# Patient Record
Sex: Female | Born: 1975
Health system: Southern US, Community
[De-identification: ages and names within clinical notes are randomized; demographics above are authoritative.]

## PROBLEM LIST (undated history)

## (undated) DIAGNOSIS — N189 Chronic kidney disease, unspecified: Secondary | ICD-10-CM

## (undated) HISTORY — PX: BREAST ENHANCEMENT SURGERY: SHX7

## (undated) HISTORY — DX: Chronic kidney disease, unspecified: N18.9

---

## 2005-12-24 ENCOUNTER — Other Ambulatory Visit: Admission: RE | Admit: 2005-12-24 | Discharge: 2005-12-24 | Payer: Self-pay | Admitting: Obstetrics and Gynecology

## 2007-12-16 ENCOUNTER — Inpatient Hospital Stay (HOSPITAL_COMMUNITY): Admission: RE | Admit: 2007-12-16 | Discharge: 2007-12-19 | Payer: Self-pay | Admitting: Obstetrics and Gynecology

## 2008-10-08 ENCOUNTER — Ambulatory Visit: Payer: Self-pay | Admitting: Internal Medicine

## 2010-03-04 ENCOUNTER — Ambulatory Visit (HOSPITAL_COMMUNITY): Admission: RE | Admit: 2010-03-04 | Discharge: 2010-03-04 | Payer: Self-pay | Admitting: Orthopedic Surgery

## 2010-03-04 ENCOUNTER — Encounter: Admission: RE | Admit: 2010-03-04 | Discharge: 2010-03-04 | Payer: Self-pay | Admitting: Obstetrics and Gynecology

## 2011-04-14 NOTE — Op Note (Signed)
NAMEBERLIN, VIERECK               ACCOUNT NO.:  [id number]   MEDICAL RECORD NO.:  [id number]          PATIENT TYPE:  INP   LOCATION:  9199                          FACILITY:  [facility]   PHYSICIAN:  Huel Cote, M.D. DATE OF BIRTH:  1976-05-01   DATE OF PROCEDURE:  12/16/2007  DATE OF DISCHARGE:                               OPERATIVE REPORT   PREOPERATIVE DIAGNOSES:  1. Term pregnancy at 39 plus weeks.  2. Previous C-section, declines VBAC (vaginal birth after cesarean).  3. Nephropathy, stable with a normal creatinine.   POSTOPERATIVE DIAGNOSES:  1. Term pregnancy at 39 plus weeks.  2. Previous C-section, declines VBAC (vaginal birth after cesarean).  3. Nephropathy, stable with a normal creatinine.   PROCEDURE:  Repeat low transverse C-section.   SURGEON:  Huel Cote, M.D.   ASSISTANT:  Zenaida Niece, M.D.   ANESTHESIA:  Spinal.   FINDINGS:  A viable female infant in vertex presentation.  Apgars were 9  and 9.  Weight was 7 pounds 13 ounces.  There was a nuchal cord x2 which  was loose and reduced over the head.  Normal uterus, tubes and ovaries  were noted.  Specimen was the placenta which was sent to labor and  delivery after cord blood collection.   ESTIMATED BLOOD LOSS:  Seven hundred mL.   URINE OUTPUT:  Four hundred and 50 mL clear urine.   IV FLUIDS:  Three thousand mL LR.   PROCEDURE IN DETAIL:  The patient was taken to the operating room where  spinal anesthesia was obtained without difficulty.  She was then prepped  and draped in normal sterile fashion in the dorsal supine position with  a leftward tilt.  A Pfannenstiel's skin incision was then made through a  preexisting scar and carried through to underlying layer of fascia by  sharp dissection and Bovie cautery.  Fascia was then nicked in the  midline and the incision was extended laterally with Mayo scissors.  Inferior aspect was grasped with Kocher clamps, elevated and dissected  off the  underlying rectus muscles.  In a similar fashion the superior  aspect was dissected off the rectus muscles.  The rest of the rectus  muscles were separated in the midline and the peritoneal cavity entered  bluntly.  The peritoneal incision was then extended both superiorly and  inferiorly with careful attention to avoid both bowel and bladder.  The  Alexis self retaining wound retractor was then placed within the  incision and the lower uterine segment exposed nicely.  A transverse  incision was then made with a scalpel and the uterine cavity was entered  bluntly.  This was then extended bluntly.  The bag was ruptured with  Allis clamp and clear fluid noted.  The infant's head was then delivered  atraumatically.  The nose and mouth bulb suctioned.  The remainder of  the body were delivered after the nuchal cord reduced without  difficulty.  The cord was clamped and cut and the infant was handed to  waiting pediatricians.  The uterus was massaged and the placenta  expressed spontaneously.  It was then cleared of all clots and debris  with moist lap sponge.  The uterine incision was then closed in two  layers, the first a running locked layer of zero chromic, similarly the  second layer was also zero chromic and this was done in an imbricating  fashion.  At the conclusion of the uterine closure the incision appeared  completely hemostatic.  Tubes and ovaries were inspected and found to be  normal.  The gutters were cleared of any clots and debris and the pelvis  and the uterine incision once again inspected and found to be  hemostatic.   At this point the Jon Gills was removed from the incision.  The rectus  muscles were reapproximated and the subfascial planes inspected and  found to be hemostatic.  Any areas of oozing were controlled with Bovie  cautery and finally the fascia was closed with zero Vicryl in a running  fashion and the skin was closed with staples.  Sponge, lap and needle   counts were correct x2 and the patient was taken to the recovery room in  stable condition.      Huel Cote, M.D.  Electronically Signed     KR/MEDQ  D:  12/16/2007  T:  12/16/2007  Job:  [id number]

## 2011-04-14 NOTE — H&P (Signed)
NAMECARL, BLEECKER NO.:  [id number]   MEDICAL RECORD NO.:  [id number]          PATIENT TYPE:  INP   LOCATION:  NA                            [facility]:  [facility]   PHYSICIAN:  Huel Cote, M.D. DATE OF BIRTH:  1976-07-03   DATE OF ADMISSION:  DATE OF DISCHARGE:                              HISTORY & PHYSICAL   DATE OF SURGERY:  December 16, 2007 at 9:45 a.m.   HISTORY:  The patient is a 35 year old G4 P1 021 who is coming in at [redacted]  weeks gestation for a scheduled repeat cesarean section and declines a  trial of labor, given a previous cesarean section.  The patient's  prenatal care has been complicated only by a history of nephropathy in  the patient which has been stable.  She had no other significant issues  and has had no significant proteinuria the entire pregnancy except on  her last visit in the office the day before surgery was noted to have 1+  protein, however did have a creatinine performed on her preop labs which  was normal.  She is followed by nephrology and will see Dr. Eliott Nine  approximately 2 months postpartum for a followup of this kidney  function.   PRENATAL LABS:  B+, antibody negative, RPR was nonreactive.  Rubella  immune, hepatitis B surface antigen negative, HIV negative, GC negative,  chlamydia negative, group B strep negative.  First trimester screen also  within normal limits.  Fetal anatomy scan performed at 18 weeks was  normal except for some isolated chloropexia cysts.  Past.   OBSTETRICAL HISTORY:  Significant in 2005 for a 8 pound 1 ounce infant  at 40 weeks delivered by cesarean section due to fetal heart rate  decelerations.  She then had 2 spontaneous miscarriages in 2007.   PAST MEDICAL HISTORY:  MPGN nephropathy which has been stable the entire  pregnancy, except for some isolated increased proteinuria the day before  her surgery.   PAST GYN HISTORY:  No significant abnormal Pap smears recently.   PAST SURGICAL  HISTORY:  In 2005 she had the cesarean section and some  kidney biopsies as a child.   ALLERGIES:  She is sensitive to LATEX and avoids nonsteroidals secondary  to her kidney disease.   MEDICATIONS:  None.   On admission for her cesarean section, she is afebrile, stable vital  signs.  Fetal heart rate was normal.  Her weight is 186 pounds, blood  pressure 128/78.  CARDIAC EXAM:  Regular rate and rhythm.  LUNGS:  Clear.  ABDOMEN:  Soft, gravid and nontender.  She had a cervical exam which  demonstrated a cervix which was 50 and 1+ dilated, vertex presentation.   The risks and benefits of surgery were discussed with the patient in  detail, including bleeding, infection, and possibly damage to bowel and  bladder.  The patient understands these risks and desires to proceed  with the surgery as stated.  We will undergo repeat low transverse  cesarean section and follow her proteinuria and creatinine  postoperatively.  These have been of no significance thus  far and her  blood pressures have been normal, with no preeclamptic symptoms.  The  patient again understands all risks and benefits and desires to proceed  with the surgery as stated.      Huel Cote, M.D.  Electronically Signed     KR/MEDQ  D:  12/15/2007  T:  12/15/2007  Job:  [id number]

## 2011-04-17 NOTE — Discharge Summary (Signed)
NAMEHARLIE, Vickie Washington               ACCOUNT NO.:  [id number]   MEDICAL RECORD NO.:  [id number]          PATIENT TYPE:  INP   LOCATION:  9109                          [facility]   PHYSICIAN:  Huel Cote, M.D. DATE OF BIRTH:  [birth year]   DATE OF ADMISSION:  12/16/2007  DATE OF DISCHARGE:  12/19/2007                               DISCHARGE SUMMARY   DISCHARGE DIAGNOSES:  1. Term pregnancy at 39 weeks, delivered.  2. Previous cesarean section.  Declined trial of labor.  3. Nephropathy which was stable.  4. Status post repeat low transverse C-section.   DISCHARGE MEDICATIONS:  1. Percocet 1-2 tablets p.o. every 4-6 hours p.r.n.  2. Prenatal vitamins.   DISCHARGE FOLLOWUP:  The patient is to follow up in my office in 2 weeks  for an incision check.   HOSPITAL COURSE:  The patient is a 35 year old, G4, P0-2-1 who came in  for a scheduled repeat C-section given a previous C-section and decline  of trial of labor.  For her complete history and physical, please see  the dictated history and physical done prior.  The patient's prenatal  care was essentially uncomplicated except for no nephropathy which was  followed and stable throughout the pregnancy with only a small amount of  proteinuria noted the day prior to delivery and a stable creatinine.  She underwent a repeat low transverse C-section with delivery of a  viable female infant, Apgar's were 9 and 9, weight 7 pounds 13 ounces.  There was a nuchal cord x2.  Normal uterus, tubes and ovaries were  noted.  She had an estimated blood loss of 700 mL and was admitted for  routine postoperative care.  On postop day #1, creatinine was stable at  0.6.  Hemoglobin was 11 down from 11.9.  She was doing well, tolerating  regular diet.  By postop day #3, she was ambulating well.  Pain was well-  controlled and incision was clear.  Staples were removed and Steri-  Strips placed.  She was felt stable for discharge home.      Huel Cote, M.D.  Electronically Signed     KR/MEDQ  D:  01/11/2008  T:  01/12/2008  Job:  [id number]

## 2011-08-20 LAB — BASIC METABOLIC PANEL
Calcium: 8.7
Chloride: 104
Creatinine, Ser: 0.6
GFR calc Af Amer: >60
Potassium: 3.9

## 2011-08-20 LAB — CBC
HCT: 34.2 — ABNORMAL LOW
Hemoglobin: 11.9 — ABNORMAL LOW
MCHC: 34.7
Platelets: 210
RBC: 3.23 — ABNORMAL LOW
RBC: 3.58 — ABNORMAL LOW
RDW: 14.8

## 2011-08-20 LAB — COMPREHENSIVE METABOLIC PANEL
AST: 25
Chloride: 104
Creatinine, Ser: 0.55
Sodium: 134 — ABNORMAL LOW

## 2011-08-20 LAB — CCBB MATERNAL DONOR DRAW

## 2016-09-07 ENCOUNTER — Ambulatory Visit (INDEPENDENT_AMBULATORY_CARE_PROVIDER_SITE_OTHER): Payer: 59 | Admitting: Orthopaedic Surgery

## 2016-09-07 DIAGNOSIS — S76012A Strain of muscle, fascia and tendon of left hip, initial encounter: Secondary | ICD-10-CM | POA: Diagnosis not present

## 2016-09-28 ENCOUNTER — Ambulatory Visit (INDEPENDENT_AMBULATORY_CARE_PROVIDER_SITE_OTHER): Payer: 59 | Admitting: Physician Assistant

## 2016-09-28 ENCOUNTER — Encounter (INDEPENDENT_AMBULATORY_CARE_PROVIDER_SITE_OTHER): Payer: Self-pay | Admitting: Physician Assistant

## 2016-09-28 VITALS — Ht 65.0 in | Wt 128.0 lb

## 2016-09-28 DIAGNOSIS — M25552 Pain in left hip: Secondary | ICD-10-CM

## 2016-09-28 NOTE — Addendum Note (Signed)
Addended by: Cleda Clarks on: 09/28/2016 09:16 AM   Modules accepted: Orders

## 2016-09-28 NOTE — Progress Notes (Signed)
   Office Visit Note   Patient: Vickie Washington           Date of Birth: [birth year]           MRN: [id number] Visit Date: 09/28/2016              Requested by: No referring provider defined for this encounter. PCP: Pcp Not In System   Assessment & Plan: Visit Diagnoses: No diagnosis found.  Plan: MRI Left hip to rule out any cartilage involvement   Follow-Up Instructions: No Follow-up on file.   Orders:  No orders of the defined types were placed in this encounter.  No orders of the defined types were placed in this encounter.     Procedures: No procedures performed   Clinical Data: No additional findings.   Subjective: No chief complaint on file.   Patient returns for follow up on left hip. States pain is some better. Doing Pt, Dry needling. States feels pulling sensation when carrying something. Still unable to run. Also states steroid pack was little to no help. The patient reports that her hip pain remains 7 out of 10 at worst. She denies any radicular symptoms down the left hip. Continues to have pain in the groin area of her left hip no longer having lateral hip pain. She also reports popping sensation in the hip with abduction but this is not painful.    Review of Systems See HPI   Objective: Vital Signs: There were no vitals taken for this visit.  Physical Exam  Left Hip Exam  Left hip exam is normal.  Comments:  She has pain with flexion and external rotation of the left hip.      Specialty Comments:  No specialty comments available.  Imaging: No results found.   PMFS History: There are no active problems to display for this patient.  No past medical history on file.  No family history on file.  No past surgical history on file. Social History   Occupational History  . Not on file.   Social History Main Topics  . Smoking status: Not on file  . Smokeless tobacco: Not on file  . Alcohol use Not on file  . Drug use: Unknown  .  Sexual activity: Not on file

## 2016-10-03 ENCOUNTER — Ambulatory Visit
Admission: RE | Admit: 2016-10-03 | Discharge: 2016-10-03 | Disposition: A | Discharge status: Home / Self Care | Payer: 59 | Source: Ambulatory Visit | Attending: Physician Assistant | Admitting: Physician Assistant

## 2016-10-03 DIAGNOSIS — M25552 Pain in left hip: Secondary | ICD-10-CM

## 2016-10-12 ENCOUNTER — Ambulatory Visit (INDEPENDENT_AMBULATORY_CARE_PROVIDER_SITE_OTHER): Payer: 59 | Admitting: Physician Assistant

## 2016-10-19 ENCOUNTER — Ambulatory Visit (INDEPENDENT_AMBULATORY_CARE_PROVIDER_SITE_OTHER): Payer: 59 | Admitting: Physician Assistant

## 2016-10-19 ENCOUNTER — Encounter (INDEPENDENT_AMBULATORY_CARE_PROVIDER_SITE_OTHER): Payer: Self-pay | Admitting: Physician Assistant

## 2016-10-19 DIAGNOSIS — M25552 Pain in left hip: Secondary | ICD-10-CM

## 2016-10-19 NOTE — Progress Notes (Signed)
   Office Visit Note   Patient: Vickie Washington           Date of Birth: 07/29/1976           MRN: [id number] Visit Date: 10/19/2016              Requested by: No referring provider defined for this encounter. PCP: Pcp Not In System   Assessment & Plan: Visit Diagnoses: No diagnosis found.  Plan: Continue protected weightbearing for the next 1-2 weeks. Keep her work for the next 2 weeks Recommend MRI. Exam 3 months from the last  Follow-Up Instructions: Return in about 4 weeks (around 11/16/2016).   Orders:  No orders of the defined types were placed in this encounter.  No orders of the defined types were placed in this encounter.     Procedures: No procedures performed   Clinical Data: No additional findings.   Subjective: Chief Complaint  Patient presents with  . Left Hip - Follow-up    MRI review    [name] is here to review MRI of Left hip.  She states that she has been using one crutch. Overall doing some better, that at the end of day its very uncomfortable and is a sore ache, but not so bad she has to take medication for it.MRI showed a stress fracture along the medial aspect of the lower femoral neckand intertrochanteric region near the lesser trochanter.She has been protective weight bearing with a crutch for at least 5 days. She's cut out all lower body exercises. Still has soreness muscle the aching sensation in the left hip and anterior thigh    Review of Systems   Objective: Vital Signs: LMP 09/28/2016   Physical Exam  Ortho Exam   Ambulates without any assistive device and a nonantalgic gait   Specialty Comments:  No specialty comments available.  Imaging: No results found.   PMFS History: There are no active problems to display for this patient.  No past medical history on file.  No family history on file.  No past surgical history on file. Social History   Occupational History  . Not on file.   Social History Main Topics  .  Smoking status: Never Smoker  . Smokeless tobacco: Never Used  . Alcohol use Not on file  . Drug use: Unknown  . Sexual activity: Not on file

## 2016-11-16 ENCOUNTER — Ambulatory Visit (INDEPENDENT_AMBULATORY_CARE_PROVIDER_SITE_OTHER): Payer: 59 | Admitting: Orthopaedic Surgery

## 2016-11-16 ENCOUNTER — Telehealth (INDEPENDENT_AMBULATORY_CARE_PROVIDER_SITE_OTHER): Payer: Self-pay | Admitting: *Deleted

## 2016-11-16 NOTE — Telephone Encounter (Signed)
Pt called stating she didn't need to come back until after MRI sometime in feb. But pt wasn't sure if MRI has been ordered yet. CB: [phone]

## 2016-11-16 NOTE — Telephone Encounter (Signed)
Pt with pt and per ov note pt is to have repeat MRI L hip in 3 months from last MRI which would fall on Feb 4, pt will call me back to remind of MRI.

## 2016-11-16 NOTE — Telephone Encounter (Signed)
Please advise 

## 2017-01-06 ENCOUNTER — Other Ambulatory Visit (INDEPENDENT_AMBULATORY_CARE_PROVIDER_SITE_OTHER): Payer: Self-pay

## 2017-01-06 DIAGNOSIS — M25552 Pain in left hip: Secondary | ICD-10-CM

## 2017-01-13 ENCOUNTER — Ambulatory Visit
Admission: RE | Admit: 2017-01-13 | Discharge: 2017-01-13 | Disposition: A | Discharge status: Home / Self Care | Payer: 59 | Source: Ambulatory Visit | Attending: Orthopaedic Surgery | Admitting: Orthopaedic Surgery

## 2017-01-13 DIAGNOSIS — M25552 Pain in left hip: Secondary | ICD-10-CM

## 2018-07-01 ENCOUNTER — Other Ambulatory Visit: Payer: Self-pay | Admitting: Obstetrics and Gynecology

## 2018-07-01 DIAGNOSIS — R928 Other abnormal and inconclusive findings on diagnostic imaging of breast: Secondary | ICD-10-CM

## 2018-07-07 ENCOUNTER — Other Ambulatory Visit: Payer: Self-pay | Admitting: Obstetrics and Gynecology

## 2018-07-07 ENCOUNTER — Ambulatory Visit
Admission: RE | Admit: 2018-07-07 | Discharge: 2018-07-07 | Disposition: A | Discharge status: Home / Self Care | Payer: 59 | Source: Ambulatory Visit | Attending: Obstetrics and Gynecology | Admitting: Obstetrics and Gynecology

## 2018-07-07 DIAGNOSIS — R928 Other abnormal and inconclusive findings on diagnostic imaging of breast: Secondary | ICD-10-CM

## 2018-07-07 DIAGNOSIS — N63 Unspecified lump in unspecified breast: Secondary | ICD-10-CM

## 2018-08-10 ENCOUNTER — Encounter (INDEPENDENT_AMBULATORY_CARE_PROVIDER_SITE_OTHER): Payer: Self-pay | Admitting: Orthopaedic Surgery

## 2018-08-10 ENCOUNTER — Ambulatory Visit (INDEPENDENT_AMBULATORY_CARE_PROVIDER_SITE_OTHER): Payer: 59 | Admitting: Orthopaedic Surgery

## 2018-08-10 ENCOUNTER — Ambulatory Visit (INDEPENDENT_AMBULATORY_CARE_PROVIDER_SITE_OTHER): Payer: Self-pay

## 2018-08-10 DIAGNOSIS — M79671 Pain in right foot: Secondary | ICD-10-CM

## 2018-08-10 MED ORDER — DICLOFENAC SODIUM 1 % TD GEL: 2.0000 g | Freq: Four times a day (QID) | TRANSDERMAL | 3 refills | Status: AC

## 2018-08-10 NOTE — Progress Notes (Signed)
Office Visit Note   Patient: Vickie Washington           Date of Birth: [birth year]           MRN: [id number] Visit Date: 08/10/2018              Requested by: No referring provider defined for this encounter. PCP: System, Pcp Not In   Assessment & Plan: Visit Diagnoses:  1. Pain of right heel     Plan: She certainly understands that this is definitely an acute injury to her ankle.  We will put her in a short cam walking boot and shut her down completely for the next 2 weeks before allowing her gradual return to activities.  We will try some Voltaren gel as well this area.  She will let me know if things are worsening in any way.  If they do not get better my next step would be an MRI. Follow-Up Instructions: Return if symptoms worsen or fail to improve.   Orders:  Orders Placed This Encounter  Procedures  . XR Os Calcis Right   Meds ordered this encounter  Medications  . diclofenac sodium (VOLTAREN) 1 % GEL    Sig: Apply 2 g topically 4 (four) times daily.    Dispense:  100 g    Refill:  3      Procedures: No procedures performed   Clinical Data: No additional findings.   Subjective: Chief Complaint  Patient presents with  . Right Ankle - Pain, Injury  Vickie Washington comes in today for evaluation treatment of an acute injury to her right ankle and heel.  She points to the heel medially and the course the posterior tibial tendon is source of her pain.  This happened after an acute injury last week coming down some steps.  She is is walking barefoot is significantly painful to her but wearing shoes that are well fitting she is pain-free.  She is also been training for a race for the first week in November which is a marathon.  She has not injured this area before.  She has had ankle twisting injury before but not to this area.  She is someone that has had a hip stress fracture in the past.  Her bone density ended up being normal and she healed that fracture.  HPI  Review of  Systems She currently denies any headache, chest pain, short of breath, fever, chills, nausea, vomiting.  Objective: Vital Signs: There were no vitals taken for this visit.  Physical Exam She is alert and oriented x3 and in no acute distress Ortho Exam Examination of her right foot and ankle shows that she can perform a straight toe raise easily.  There is tenderness of the calcaneus and tenderness along the course of the posterior tibial tendon.  The remainder the foot and ankle exam is normal.  The foot contours appear normal. Specialty Comments:  No specialty comments available.  Imaging: Xr Os Calcis Right  Result Date: 08/10/2018 2 views of the calcaneus show no obvious acute bony injury.    PMFS History: There are no active problems to display for this patient.  History reviewed. No pertinent past medical history.  History reviewed. No pertinent family history.  History reviewed. No pertinent surgical history. Social History   Occupational History  . Not on file  Tobacco Use  . Smoking status: Never Smoker  . Smokeless tobacco: Never Used  Substance and Sexual Activity  . Alcohol use: Not on  file  . Drug use: Not on file  . Sexual activity: Not on file

## 2019-01-10 ENCOUNTER — Other Ambulatory Visit: Payer: 59

## 2019-01-11 ENCOUNTER — Other Ambulatory Visit: Payer: Self-pay | Admitting: Obstetrics and Gynecology

## 2019-01-11 ENCOUNTER — Ambulatory Visit
Admission: RE | Admit: 2019-01-11 | Discharge: 2019-01-11 | Disposition: A | Discharge status: Home / Self Care | Payer: BLUE CROSS/BLUE SHIELD | Source: Ambulatory Visit | Attending: Obstetrics and Gynecology | Admitting: Obstetrics and Gynecology

## 2019-01-11 DIAGNOSIS — N6323 Unspecified lump in the left breast, lower outer quadrant: Secondary | ICD-10-CM | POA: Diagnosis not present

## 2019-01-11 DIAGNOSIS — N63 Unspecified lump in unspecified breast: Secondary | ICD-10-CM

## 2019-07-13 ENCOUNTER — Ambulatory Visit
Admission: RE | Admit: 2019-07-13 | Discharge: 2019-07-13 | Disposition: A | Discharge status: Home / Self Care | Payer: BLUE CROSS/BLUE SHIELD | Source: Ambulatory Visit | Attending: Obstetrics and Gynecology | Admitting: Obstetrics and Gynecology

## 2019-07-13 ENCOUNTER — Other Ambulatory Visit: Payer: Self-pay

## 2019-07-13 DIAGNOSIS — N6323 Unspecified lump in the left breast, lower outer quadrant: Secondary | ICD-10-CM | POA: Diagnosis not present

## 2019-07-13 DIAGNOSIS — N6321 Unspecified lump in the left breast, upper outer quadrant: Secondary | ICD-10-CM | POA: Diagnosis not present

## 2019-07-13 DIAGNOSIS — R928 Other abnormal and inconclusive findings on diagnostic imaging of breast: Secondary | ICD-10-CM | POA: Diagnosis not present

## 2019-07-13 DIAGNOSIS — N63 Unspecified lump in unspecified breast: Secondary | ICD-10-CM

## 2019-11-13 DIAGNOSIS — Z01419 Encounter for gynecological examination (general) (routine) without abnormal findings: Secondary | ICD-10-CM | POA: Diagnosis not present

## 2019-11-13 DIAGNOSIS — Z1389 Encounter for screening for other disorder: Secondary | ICD-10-CM | POA: Diagnosis not present

## 2019-11-13 DIAGNOSIS — Z6825 Body mass index (BMI) 25.0-25.9, adult: Secondary | ICD-10-CM | POA: Diagnosis not present

## 2019-11-13 DIAGNOSIS — Z130 Encounter for screening for diseases of the blood and blood-forming organs and certain disorders involving the immune mechanism: Secondary | ICD-10-CM | POA: Diagnosis not present

## 2020-04-13 ENCOUNTER — Ambulatory Visit: Payer: BC Managed Care – PPO

## 2020-06-06 ENCOUNTER — Other Ambulatory Visit: Payer: Self-pay | Admitting: Obstetrics and Gynecology

## 2020-06-06 DIAGNOSIS — N63 Unspecified lump in unspecified breast: Secondary | ICD-10-CM

## 2020-06-10 DIAGNOSIS — Z0000 Encounter for general adult medical examination without abnormal findings: Secondary | ICD-10-CM | POA: Diagnosis not present

## 2020-06-10 DIAGNOSIS — M859 Disorder of bone density and structure, unspecified: Secondary | ICD-10-CM | POA: Diagnosis not present

## 2020-06-17 DIAGNOSIS — Z1212 Encounter for screening for malignant neoplasm of rectum: Secondary | ICD-10-CM | POA: Diagnosis not present

## 2020-06-17 DIAGNOSIS — Z1331 Encounter for screening for depression: Secondary | ICD-10-CM | POA: Diagnosis not present

## 2020-06-17 DIAGNOSIS — Z87312 Personal history of (healed) stress fracture: Secondary | ICD-10-CM | POA: Diagnosis not present

## 2020-06-17 DIAGNOSIS — G43909 Migraine, unspecified, not intractable, without status migrainosus: Secondary | ICD-10-CM | POA: Diagnosis not present

## 2020-06-17 DIAGNOSIS — N052 Unspecified nephritic syndrome with diffuse membranous glomerulonephritis: Secondary | ICD-10-CM | POA: Diagnosis not present

## 2020-06-17 DIAGNOSIS — Z0000 Encounter for general adult medical examination without abnormal findings: Secondary | ICD-10-CM | POA: Diagnosis not present

## 2020-06-17 DIAGNOSIS — M8580 Other specified disorders of bone density and structure, unspecified site: Secondary | ICD-10-CM | POA: Diagnosis not present

## 2020-06-17 DIAGNOSIS — R82998 Other abnormal findings in urine: Secondary | ICD-10-CM | POA: Diagnosis not present

## 2020-07-16 ENCOUNTER — Ambulatory Visit
Admission: RE | Admit: 2020-07-16 | Discharge: 2020-07-16 | Disposition: A | Discharge status: Home / Self Care | Payer: BC Managed Care – PPO | Source: Ambulatory Visit | Attending: Obstetrics and Gynecology | Admitting: Obstetrics and Gynecology

## 2020-07-16 ENCOUNTER — Other Ambulatory Visit: Payer: Self-pay

## 2020-07-16 DIAGNOSIS — R922 Inconclusive mammogram: Secondary | ICD-10-CM | POA: Diagnosis not present

## 2020-07-16 DIAGNOSIS — N63 Unspecified lump in unspecified breast: Secondary | ICD-10-CM

## 2020-07-16 DIAGNOSIS — N6002 Solitary cyst of left breast: Secondary | ICD-10-CM | POA: Diagnosis not present

## 2020-07-16 DIAGNOSIS — R928 Other abnormal and inconclusive findings on diagnostic imaging of breast: Secondary | ICD-10-CM | POA: Diagnosis not present

## 2020-09-17 DIAGNOSIS — Z20822 Contact with and (suspected) exposure to covid-19: Secondary | ICD-10-CM | POA: Diagnosis not present

## 2021-02-04 ENCOUNTER — Telehealth: Payer: Self-pay

## 2021-02-04 NOTE — Telephone Encounter (Signed)
Left message on machine to call back  

## 2021-02-04 NOTE — Telephone Encounter (Signed)
Vickie Banister, MD  Erroll Luna, MD; Timothy Lasso, RN Got it, thanks   She should expect a call at that number sometime today or tomorrow.      Lucile Didonato,  See below. Thanks!   DJ        Previous Messages   ----- Message -----  From: Erroll Luna, MD  Sent: 02/04/2021  7:20 AM EST  To: Vickie Banister, MD  Subject: [name]  DOB 05-30-1976   [id number]   44 yo otherwise healthy   [address] [place] [address]   Doug Shaw PCP   BC/BS insurance    Thx Again Dorian Heckle   ----- Message -----  From: Vickie Banister, MD  Sent: 02/03/2021  1:46 PM EST  To: Erroll Luna, MD, Timothy Lasso, RN  Subject: RE: question                   Tom,  Happy to help. Can you send some demographics and a good contact number to reach her and I will have [name] reach out to coordinate. Thanks.    Woodroe Chen    ----- Message -----  From: Erroll Luna, MD  Sent: 02/03/2021  1:44 PM EST  To: Vickie Banister, MD  Subject: question                     [name]  My wife is 27 soon to be 36 and wanted to get a screening colonoscopy   I recommended you for that    Can be done any time this year    Had a grandfather with colon cancer in his 57"s who died from it    Any help appreciated   Tom

## 2021-02-05 NOTE — Telephone Encounter (Signed)
The pt has been scheduled for pre visit and colon.  She will call with any questions or concerns

## 2021-02-27 DIAGNOSIS — Z1151 Encounter for screening for human papillomavirus (HPV): Secondary | ICD-10-CM | POA: Diagnosis not present

## 2021-02-27 DIAGNOSIS — Z130 Encounter for screening for diseases of the blood and blood-forming organs and certain disorders involving the immune mechanism: Secondary | ICD-10-CM | POA: Diagnosis not present

## 2021-02-27 DIAGNOSIS — Z6825 Body mass index (BMI) 25.0-25.9, adult: Secondary | ICD-10-CM | POA: Diagnosis not present

## 2021-02-27 DIAGNOSIS — Z124 Encounter for screening for malignant neoplasm of cervix: Secondary | ICD-10-CM | POA: Diagnosis not present

## 2021-02-27 DIAGNOSIS — Z01419 Encounter for gynecological examination (general) (routine) without abnormal findings: Secondary | ICD-10-CM | POA: Diagnosis not present

## 2021-03-10 ENCOUNTER — Other Ambulatory Visit: Payer: Self-pay

## 2021-03-10 ENCOUNTER — Ambulatory Visit (AMBULATORY_SURGERY_CENTER): Payer: Self-pay | Admitting: *Deleted

## 2021-03-10 VITALS — Ht 66.0 in | Wt 152.0 lb

## 2021-03-10 DIAGNOSIS — Z1211 Encounter for screening for malignant neoplasm of colon: Secondary | ICD-10-CM

## 2021-03-10 NOTE — Progress Notes (Signed)
Patient is here in-person for PV. Patient denies any allergies to eggs or soy. Patient denies any problems with anesthesia/sedation. Patient denies any oxygen use at home. Patient denies taking any diet/weight loss medications or blood thinners. Patient is not being treated for MRSA or C-diff. Patient is aware of our care-partner policy and [id number] safety protocol. EMMI education assigned to the patient for the procedure, sent to [facility].

## 2021-03-11 ENCOUNTER — Encounter: Payer: Self-pay | Admitting: Gastroenterology

## 2021-03-17 ENCOUNTER — Encounter: Payer: BC Managed Care – PPO | Admitting: Gastroenterology

## 2021-03-17 ENCOUNTER — Other Ambulatory Visit: Payer: Self-pay

## 2021-03-17 ENCOUNTER — Ambulatory Visit (AMBULATORY_SURGERY_CENTER): Payer: BC Managed Care – PPO | Admitting: Gastroenterology

## 2021-03-17 ENCOUNTER — Encounter: Payer: Self-pay | Admitting: Gastroenterology

## 2021-03-17 VITALS — BP 97/61 | HR 58 | Temp 98.2°F | Resp 11 | Ht 66.0 in | Wt 152.0 lb

## 2021-03-17 DIAGNOSIS — D124 Benign neoplasm of descending colon: Secondary | ICD-10-CM

## 2021-03-17 DIAGNOSIS — Z1211 Encounter for screening for malignant neoplasm of colon: Secondary | ICD-10-CM

## 2021-03-17 MED ORDER — SODIUM CHLORIDE 0.9 % IV SOLN: 500.0000 mL | Freq: Once | INTRAVENOUS | Status: DC

## 2021-03-17 NOTE — Progress Notes (Signed)
pt tolerated well. VSS. awake and to recovery. Report given to RN.  

## 2021-03-17 NOTE — Progress Notes (Signed)
Medical history reviewed with no changes since PV. VS assessed by C.W 

## 2021-03-17 NOTE — Progress Notes (Signed)
Called to room to assist during endoscopic procedure.  Patient ID and intended procedure confirmed with present staff. Received instructions for my participation in the procedure from the performing physician.  

## 2021-03-17 NOTE — Patient Instructions (Addendum)
Handout on polyps, diverticulosis  given Resume previous diet Continue current medications Await pathology results YOU HAD AN ENDOSCOPIC PROCEDURE TODAY AT [facility]:   Refer to the procedure report that was given to you for any specific questions about what was found during the examination.  If the procedure report does not answer your questions, please call your gastroenterologist to clarify.  If you requested that your care partner not be given the details of your procedure findings, then the procedure report has been included in a sealed envelope for you to review at your convenience later.  YOU SHOULD EXPECT: Some feelings of bloating in the abdomen. Passage of more gas than usual.  Walking can help get rid of the air that was put into your GI tract during the procedure and reduce the bloating. If you had a lower endoscopy (such as a colonoscopy or flexible sigmoidoscopy) you may notice spotting of blood in your stool or on the toilet paper. If you underwent a bowel prep for your procedure, you may not have a normal bowel movement for a few days.  Please Note:  You might notice some irritation and congestion in your nose or some drainage.  This is from the oxygen used during your procedure.  There is no need for concern and it should clear up in a day or so.  SYMPTOMS TO REPORT IMMEDIATELY:   Following lower endoscopy (colonoscopy or flexible sigmoidoscopy):  Excessive amounts of blood in the stool  Significant tenderness or worsening of abdominal pains  Swelling of the abdomen that is new, acute  Fever of 100F or higher  For urgent or emergent issues, a gastroenterologist can be reached at any hour by calling [phone]. Do not use MyChart messaging for urgent concerns.   DIET:  We do recommend a small meal at first, but then you may proceed to your regular diet.  Drink plenty of fluids but you should avoid alcoholic beverages for 24 hours.  ACTIVITY:  You should  plan to take it easy for the rest of today and you should NOT DRIVE or use heavy machinery until tomorrow (because of the sedation medicines used during the test).    FOLLOW UP: Our staff will call the number listed on your records 48-72 hours following your procedure to check on you and address any questions or concerns that you may have regarding the information given to you following your procedure. If we do not reach you, we will leave a message.  We will attempt to reach you two times.  During this call, we will ask if you have developed any symptoms of COVID 19. If you develop any symptoms (ie: fever, flu-like symptoms, shortness of breath, cough etc.) before then, please call [phone].  If you test positive for Covid 19 in the 2 weeks post procedure, please call and report this information to [place].    If any biopsies were taken you will be contacted by phone or by letter within the next 1-3 weeks.  Please call us at [phone] if you have not heard about the biopsies in 3 weeks.   SIGNATURES/CONFIDENTIALITY: You and/or your care partner have signed paperwork which will be entered into your electronic medical record.  These signatures attest to the fact that that the information above on your After Visit Summary has been reviewed and is understood.  Full responsibility of the confidentiality of this discharge information lies with you and/or your care-partner.

## 2021-03-17 NOTE — Op Note (Signed)
[facility] Patient Name: Vickie Washington Procedure Date: 03/17/2021 9:32 AM MRN: [id number] Endoscopist: Milus Banister , MD Age: 45 Referring MD:  Date of Birth: Dec 27, 1975 Gender: Female Account #: [id number] Procedure:                Colonoscopy Indications:              Screening for colorectal malignant neoplasm Medicines:                Monitored Anesthesia Care Procedure:                Pre-Anesthesia Assessment:                           - Prior to the procedure, a History and Physical                            was performed, and patient medications and                            allergies were reviewed. The patient's tolerance of                            previous anesthesia was also reviewed. The risks                            and benefits of the procedure and the sedation                            options and risks were discussed with the patient.                            All questions were answered, and informed consent                            was obtained. Prior Anticoagulants: The patient has                            taken no previous anticoagulant or antiplatelet                            agents. ASA Grade Assessment: I - A normal, healthy                            patient. After reviewing the risks and benefits,                            the patient was deemed in satisfactory condition to                            undergo the procedure.                           After obtaining informed consent, the colonoscope  was passed under direct vision. Throughout the                            procedure, the patient's blood pressure, pulse, and                            oxygen saturations were monitored continuously. The                            Colonoscope was introduced through the anus and                            advanced to the the cecum, identified by                            appendiceal orifice and ileocecal  valve. The                            colonoscopy was performed without difficulty. The                            patient tolerated the procedure well. The quality                            of the bowel preparation was good. The ileocecal                            valve, appendiceal orifice, and rectum were                            photographed. Scope In: 9:39:09 AM Scope Out: 9:53:10 AM Scope Withdrawal Time: 0 hours 8 minutes 6 seconds  Total Procedure Duration: 0 hours 14 minutes 1 second  Findings:                 A 3 mm polyp was found in the descending colon. The                            polyp was sessile. The polyp was removed with a                            cold snare. Resection and retrieval were complete.                           A few small-mouthed diverticula were found in the                            left colon.                           The exam was otherwise without abnormality on                            direct and retroflexion views. Complications:  No immediate complications. Estimated blood loss:                            None. Estimated Blood Loss:     Estimated blood loss: none. Impression:               - One 3 mm polyp in the descending colon, removed                            with a cold snare. Resected and retrieved.                           - Diverticulosis in the left colon.                           - The examination was otherwise normal on direct                            and retroflexion views. Recommendation:           - Patient has a contact number available for                            emergencies. The signs and symptoms of potential                            delayed complications were discussed with the                            patient. Return to normal activities tomorrow.                            Written discharge instructions were provided to the                            patient.                           -  Resume previous diet.                           - Continue present medications.                           - Await pathology results. Milus Banister, MD 03/17/2021 9:56:48 AM This report has been signed electronically.

## 2021-03-19 ENCOUNTER — Telehealth: Payer: Self-pay | Admitting: *Deleted

## 2021-03-19 ENCOUNTER — Telehealth: Payer: Self-pay

## 2021-03-19 NOTE — Telephone Encounter (Signed)
NO ANSWER, MESSAGE LEFT FOR PATIENT. 

## 2021-03-19 NOTE — Telephone Encounter (Signed)
  Follow up Call-  Call back number 03/17/2021  Post procedure Call Back phone  # [phone]  Permission to leave phone message Yes  Some recent data might be hidden     Patient questions:  Do you have a fever, pain , or abdominal swelling? No. Pain Score  0 *  Have you tolerated food without any problems? Yes.    Have you been able to return to your normal activities? Yes.    Do you have any questions about your discharge instructions: Diet   No. Medications  No. Follow up visit  No.  Do you have questions or concerns about your Care? No.  Actions: * If pain score is 4 or above: No action needed, pain <4.  1. Have you developed a fever since your procedure? no  2.   Have you had an respiratory symptoms (SOB or cough) since your procedure? no  3.   Have you tested positive for COVID 19 since your procedure no  4.   Have you had any family members/close contacts diagnosed with the COVID 19 since your procedure?  no   If yes to any of these questions please route to Joylene John, RN and Joella Prince, RN

## 2021-07-03 DIAGNOSIS — Z0000 Encounter for general adult medical examination without abnormal findings: Secondary | ICD-10-CM | POA: Diagnosis not present

## 2021-07-03 DIAGNOSIS — M8589 Other specified disorders of bone density and structure, multiple sites: Secondary | ICD-10-CM | POA: Diagnosis not present

## 2021-07-03 DIAGNOSIS — M8580 Other specified disorders of bone density and structure, unspecified site: Secondary | ICD-10-CM | POA: Diagnosis not present

## 2021-08-25 DIAGNOSIS — H5213 Myopia, bilateral: Secondary | ICD-10-CM | POA: Diagnosis not present

## 2021-10-15 ENCOUNTER — Other Ambulatory Visit: Payer: Self-pay | Admitting: Obstetrics and Gynecology

## 2021-10-15 DIAGNOSIS — Z1231 Encounter for screening mammogram for malignant neoplasm of breast: Secondary | ICD-10-CM

## 2021-11-19 ENCOUNTER — Other Ambulatory Visit: Payer: Self-pay | Admitting: Obstetrics and Gynecology

## 2021-11-19 ENCOUNTER — Ambulatory Visit
Admission: RE | Admit: 2021-11-19 | Discharge: 2021-11-19 | Disposition: A | Discharge status: Home / Self Care | Payer: 59 | Source: Ambulatory Visit | Attending: Obstetrics and Gynecology | Admitting: Obstetrics and Gynecology

## 2021-11-19 DIAGNOSIS — Z1231 Encounter for screening mammogram for malignant neoplasm of breast: Secondary | ICD-10-CM

## 2022-04-23 DIAGNOSIS — Z1389 Encounter for screening for other disorder: Secondary | ICD-10-CM | POA: Diagnosis not present

## 2022-04-23 DIAGNOSIS — N760 Acute vaginitis: Secondary | ICD-10-CM | POA: Diagnosis not present

## 2022-04-23 DIAGNOSIS — R895 Abnormal microbiological findings in specimens from other organs, systems and tissues: Secondary | ICD-10-CM | POA: Diagnosis not present

## 2022-04-23 DIAGNOSIS — Z130 Encounter for screening for diseases of the blood and blood-forming organs and certain disorders involving the immune mechanism: Secondary | ICD-10-CM | POA: Diagnosis not present

## 2022-04-23 DIAGNOSIS — L293 Anogenital pruritus, unspecified: Secondary | ICD-10-CM | POA: Diagnosis not present

## 2022-04-23 DIAGNOSIS — Z6825 Body mass index (BMI) 25.0-25.9, adult: Secondary | ICD-10-CM | POA: Diagnosis not present

## 2022-04-23 DIAGNOSIS — Z01419 Encounter for gynecological examination (general) (routine) without abnormal findings: Secondary | ICD-10-CM | POA: Diagnosis not present

## 2022-07-29 DIAGNOSIS — M8580 Other specified disorders of bone density and structure, unspecified site: Secondary | ICD-10-CM | POA: Diagnosis not present

## 2022-07-29 DIAGNOSIS — R7989 Other specified abnormal findings of blood chemistry: Secondary | ICD-10-CM | POA: Diagnosis not present

## 2022-07-29 DIAGNOSIS — Z0000 Encounter for general adult medical examination without abnormal findings: Secondary | ICD-10-CM | POA: Diagnosis not present

## 2022-08-05 DIAGNOSIS — R635 Abnormal weight gain: Secondary | ICD-10-CM | POA: Diagnosis not present

## 2022-08-05 DIAGNOSIS — M8580 Other specified disorders of bone density and structure, unspecified site: Secondary | ICD-10-CM | POA: Diagnosis not present

## 2022-08-05 DIAGNOSIS — Z1331 Encounter for screening for depression: Secondary | ICD-10-CM | POA: Diagnosis not present

## 2022-08-05 DIAGNOSIS — Z0000 Encounter for general adult medical examination without abnormal findings: Secondary | ICD-10-CM | POA: Diagnosis not present

## 2022-08-05 DIAGNOSIS — Z1339 Encounter for screening examination for other mental health and behavioral disorders: Secondary | ICD-10-CM | POA: Diagnosis not present

## 2022-10-13 ENCOUNTER — Other Ambulatory Visit: Payer: Self-pay | Admitting: Obstetrics and Gynecology

## 2022-10-13 DIAGNOSIS — Z1231 Encounter for screening mammogram for malignant neoplasm of breast: Secondary | ICD-10-CM

## 2022-10-27 DIAGNOSIS — H5213 Myopia, bilateral: Secondary | ICD-10-CM | POA: Diagnosis not present

## 2022-11-26 ENCOUNTER — Ambulatory Visit: Payer: 59

## 2023-01-07 ENCOUNTER — Ambulatory Visit
Admission: RE | Admit: 2023-01-07 | Discharge: 2023-01-07 | Disposition: A | Discharge status: Home / Self Care | Payer: BC Managed Care – PPO | Source: Ambulatory Visit | Attending: Obstetrics and Gynecology | Admitting: Obstetrics and Gynecology

## 2023-01-07 DIAGNOSIS — Z1231 Encounter for screening mammogram for malignant neoplasm of breast: Secondary | ICD-10-CM | POA: Diagnosis not present

## 2023-01-13 ENCOUNTER — Ambulatory Visit: Payer: 59

## 2023-02-11 ENCOUNTER — Ambulatory Visit: Payer: 59

## 2023-05-09 IMAGING — MG DIGITAL SCREENING BREAST BILAT IMPLANT W/ TOMO W/ CAD
9 of 12 series · 9 of 28 positions shown · non-contrast
Comparison: Previous exam(s).

CLINICAL DATA: Screening.

EXAM:
DIGITAL SCREENING BILATERAL MAMMOGRAM WITH IMPLANTS, CAD AND
TOMOSYNTHESIS
TECHNIQUE: Bilateral screening digital craniocaudal and mediolateral oblique
mammograms were obtained. Bilateral screening digital breast
tomosynthesis was performed. The images were evaluated with
computer-aided detection. Standard and/or implant displaced views
were performed.

[L CC]
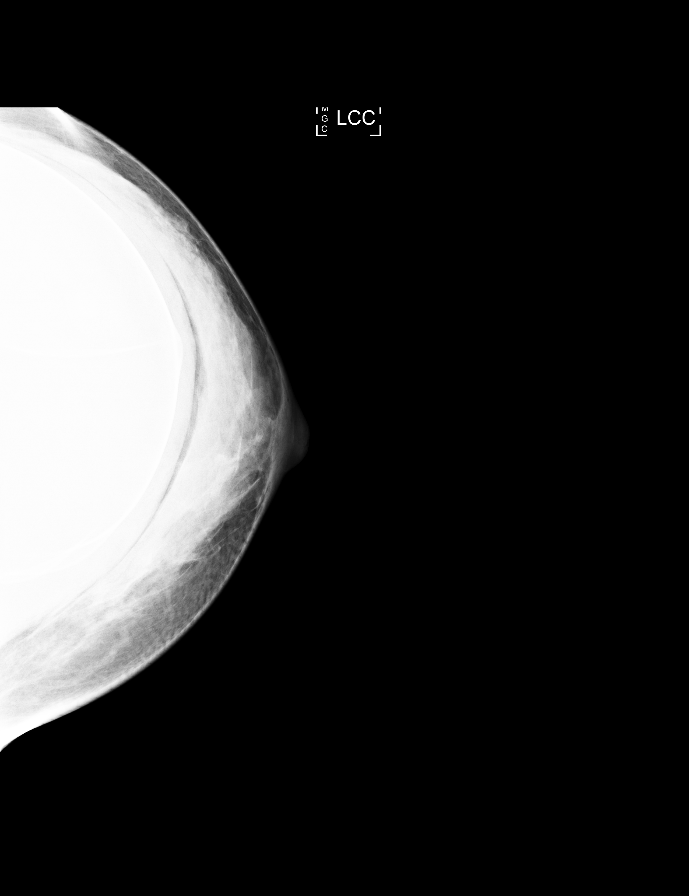

[R MLO]
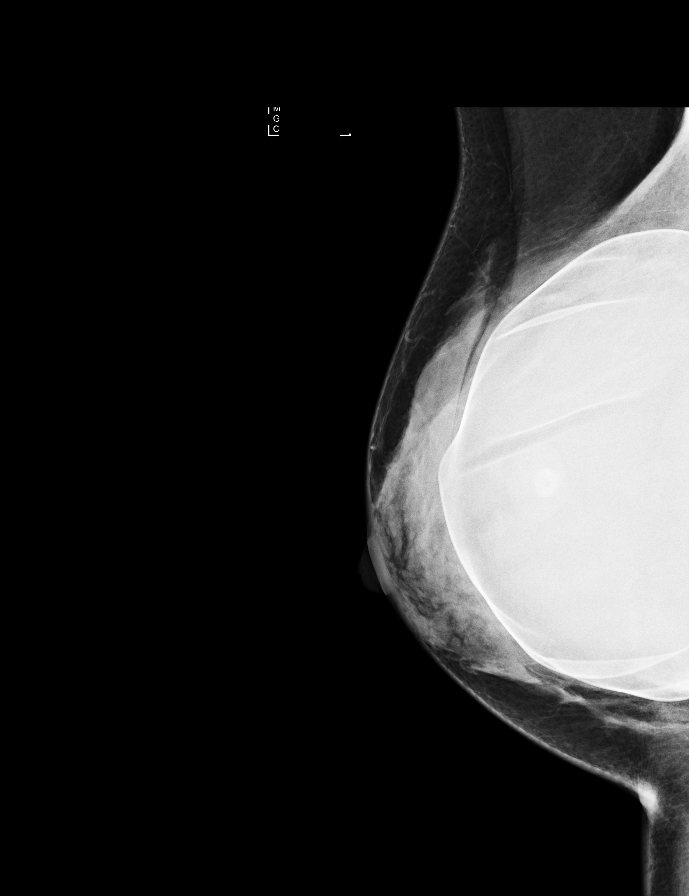

[L MLO]
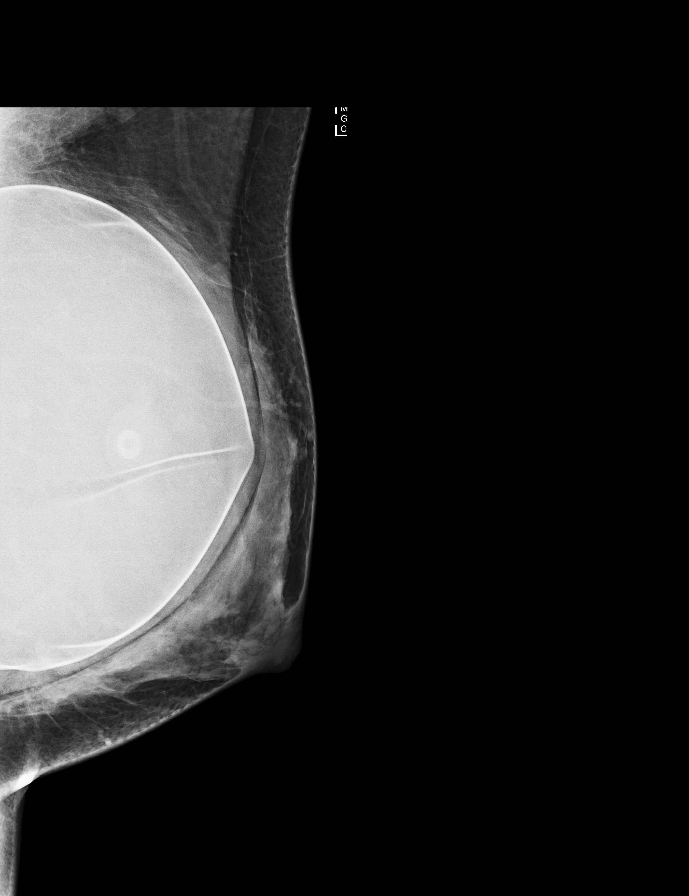

[R CC]
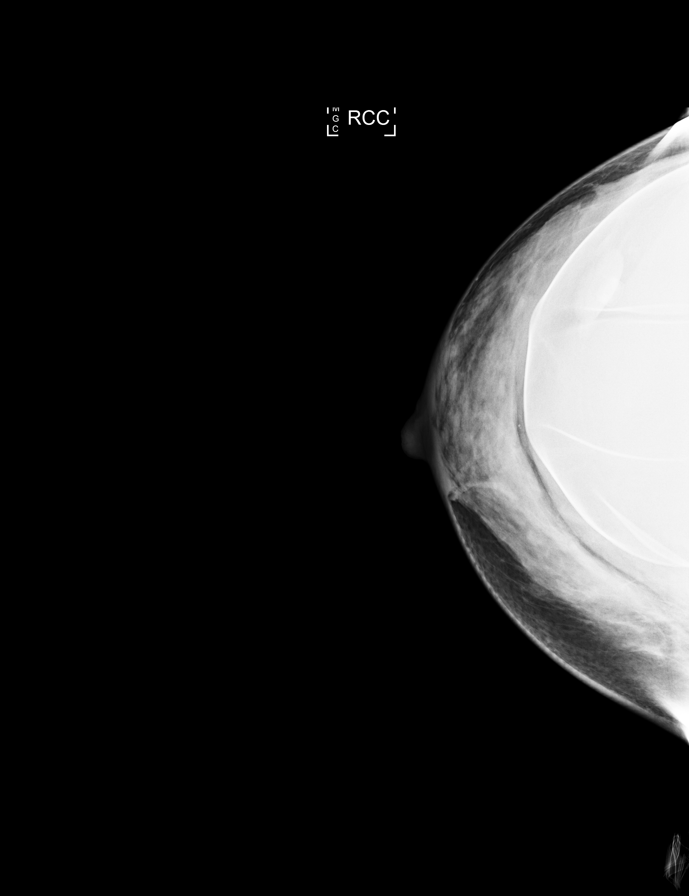

[L MLO synth-2D]
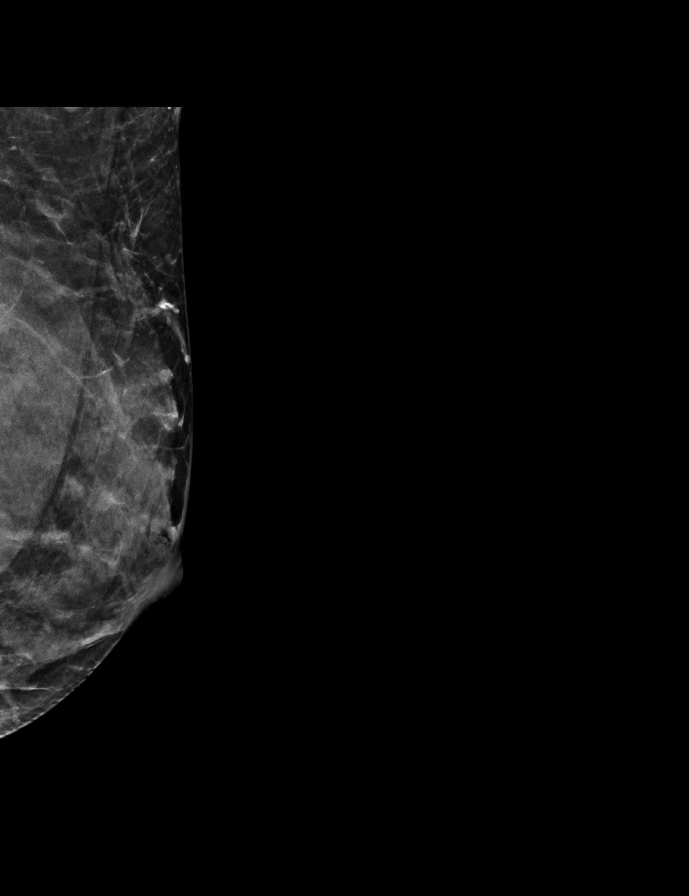

[R CC synth-2D]
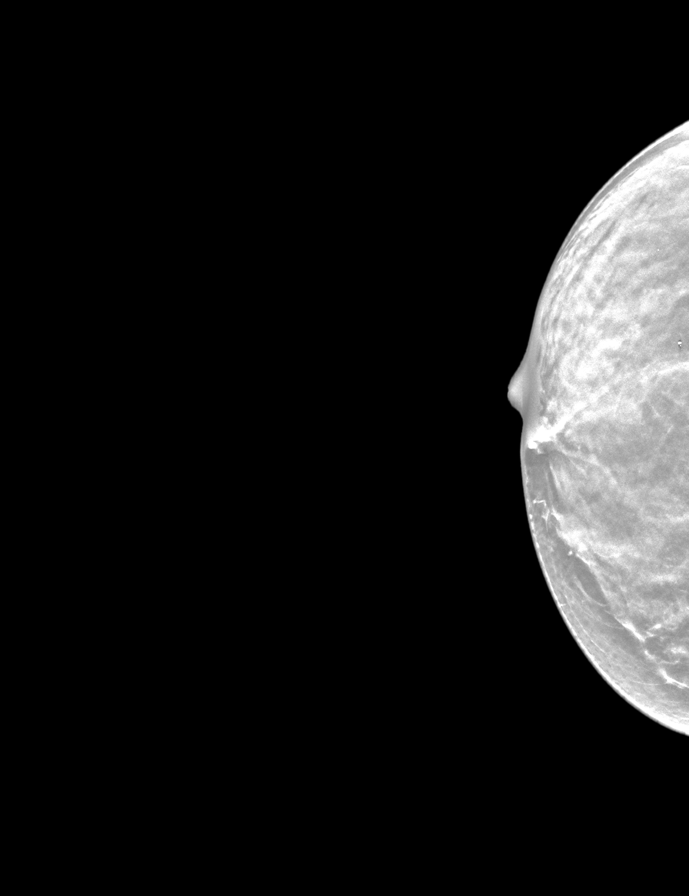

[L CC synth-2D]
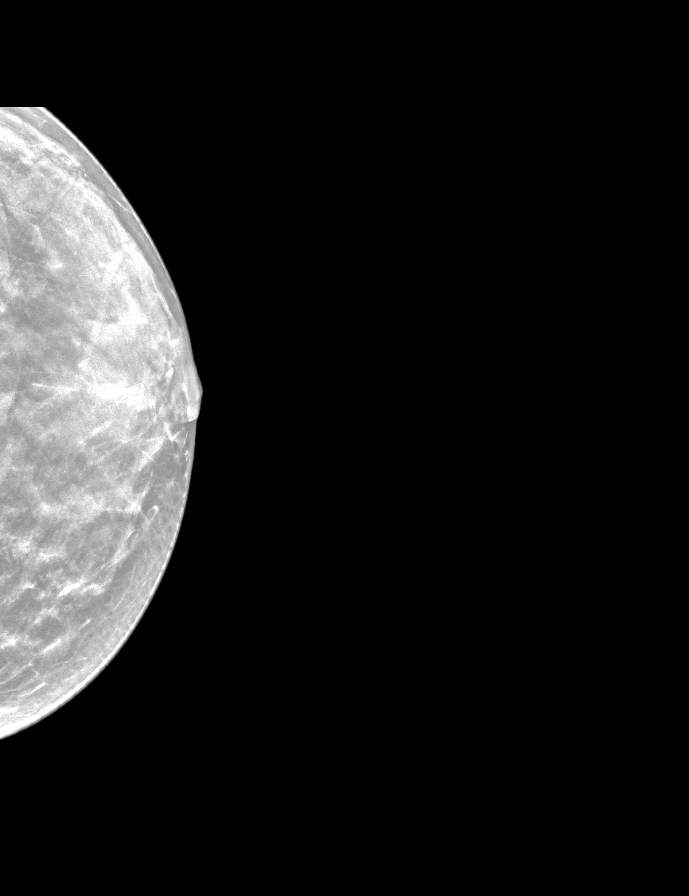

[R MLO synth-2D]
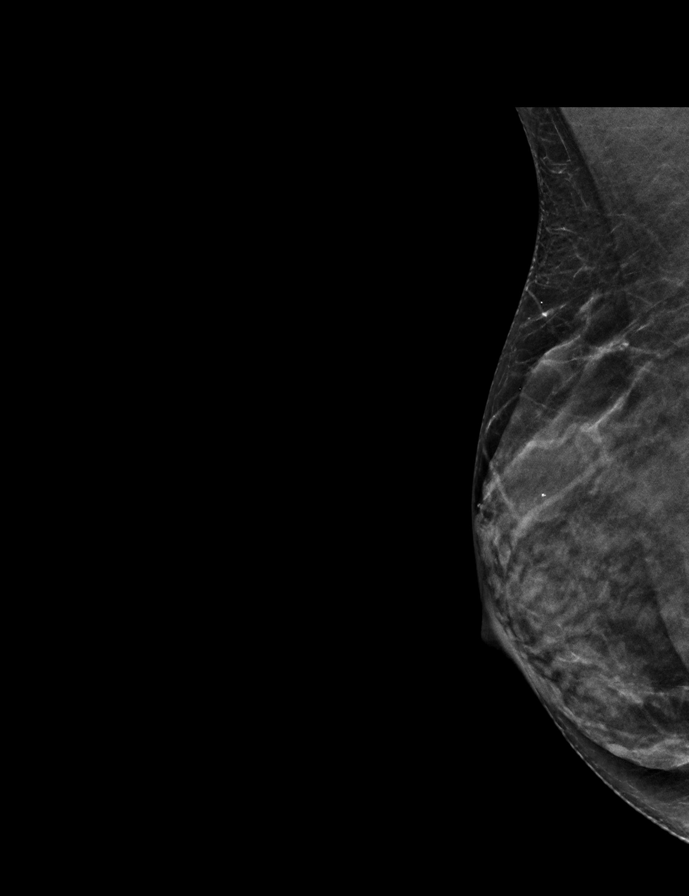

[R MLOID BREAST TOMOSYNTHESIS IMAGE tomo · tomo slice 23/45.0]
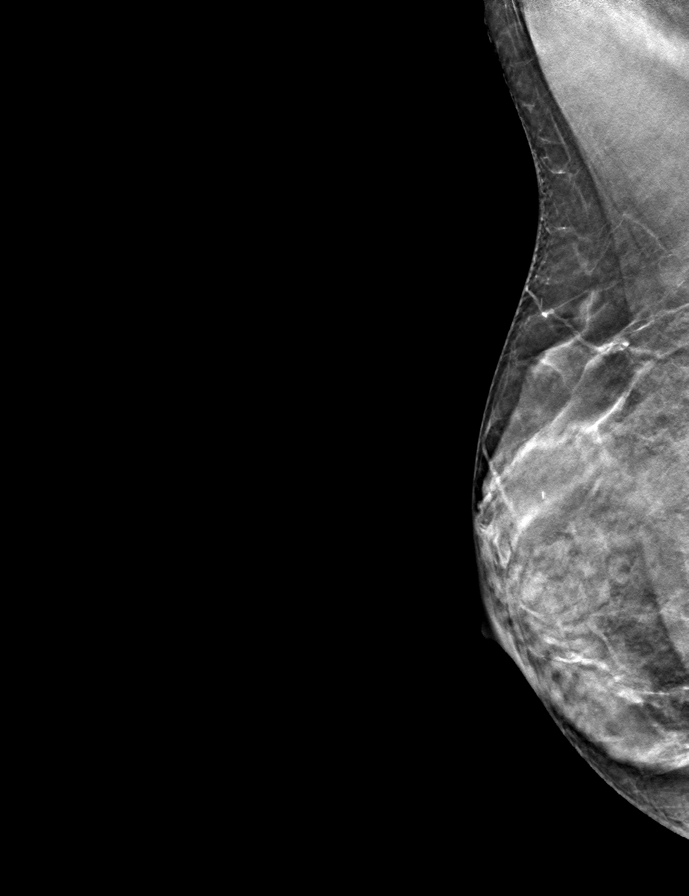

[9 of 28 positions shown; findings below may reference images not displayed]

ACR Breast Density Category d: The breast tissue is extremely dense,
which lowers the sensitivity of mammography.
FINDINGS: The patient has retropectoral implants. There are no findings
suspicious for malignancy.
IMPRESSION: No mammographic evidence of malignancy. A result letter of this
screening mammogram will be mailed directly to the patient.

RECOMMENDATION:
Screening mammogram in one year. (Code:GG-[id number])

BI-RADS CATEGORY  1:  Negative.

## 2023-05-17 DIAGNOSIS — Z6827 Body mass index (BMI) 27.0-27.9, adult: Secondary | ICD-10-CM | POA: Diagnosis not present

## 2023-05-17 DIAGNOSIS — Z01419 Encounter for gynecological examination (general) (routine) without abnormal findings: Secondary | ICD-10-CM | POA: Diagnosis not present

## 2023-05-17 DIAGNOSIS — R208 Other disturbances of skin sensation: Secondary | ICD-10-CM | POA: Diagnosis not present

## 2023-05-17 DIAGNOSIS — Z1389 Encounter for screening for other disorder: Secondary | ICD-10-CM | POA: Diagnosis not present

## 2023-08-18 DIAGNOSIS — M8580 Other specified disorders of bone density and structure, unspecified site: Secondary | ICD-10-CM | POA: Diagnosis not present

## 2023-08-18 DIAGNOSIS — R7989 Other specified abnormal findings of blood chemistry: Secondary | ICD-10-CM | POA: Diagnosis not present

## 2023-08-25 DIAGNOSIS — Z23 Encounter for immunization: Secondary | ICD-10-CM | POA: Diagnosis not present

## 2023-08-25 DIAGNOSIS — Z1339 Encounter for screening examination for other mental health and behavioral disorders: Secondary | ICD-10-CM | POA: Diagnosis not present

## 2023-08-25 DIAGNOSIS — R82998 Other abnormal findings in urine: Secondary | ICD-10-CM | POA: Diagnosis not present

## 2023-08-25 DIAGNOSIS — L509 Urticaria, unspecified: Secondary | ICD-10-CM | POA: Diagnosis not present

## 2023-08-25 DIAGNOSIS — Z1212 Encounter for screening for malignant neoplasm of rectum: Secondary | ICD-10-CM | POA: Diagnosis not present

## 2023-08-25 DIAGNOSIS — Z1331 Encounter for screening for depression: Secondary | ICD-10-CM | POA: Diagnosis not present

## 2023-08-25 DIAGNOSIS — Z0000 Encounter for general adult medical examination without abnormal findings: Secondary | ICD-10-CM | POA: Diagnosis not present

## 2023-11-08 DIAGNOSIS — F411 Generalized anxiety disorder: Secondary | ICD-10-CM | POA: Diagnosis not present

## 2023-12-06 DIAGNOSIS — H5213 Myopia, bilateral: Secondary | ICD-10-CM | POA: Diagnosis not present

## 2023-12-09 DIAGNOSIS — F411 Generalized anxiety disorder: Secondary | ICD-10-CM | POA: Diagnosis not present

## 2023-12-13 ENCOUNTER — Other Ambulatory Visit: Payer: Self-pay | Admitting: Obstetrics and Gynecology

## 2023-12-13 DIAGNOSIS — Z1231 Encounter for screening mammogram for malignant neoplasm of breast: Secondary | ICD-10-CM

## 2023-12-20 DIAGNOSIS — F411 Generalized anxiety disorder: Secondary | ICD-10-CM | POA: Diagnosis not present

## 2024-01-06 DIAGNOSIS — F411 Generalized anxiety disorder: Secondary | ICD-10-CM | POA: Diagnosis not present

## 2024-01-12 DIAGNOSIS — F411 Generalized anxiety disorder: Secondary | ICD-10-CM | POA: Diagnosis not present

## 2024-01-13 ENCOUNTER — Ambulatory Visit
Admission: RE | Admit: 2024-01-13 | Discharge: 2024-01-13 | Disposition: A | Discharge status: Home / Self Care | Payer: BC Managed Care – PPO | Source: Ambulatory Visit | Attending: Obstetrics and Gynecology | Admitting: Obstetrics and Gynecology

## 2024-01-13 DIAGNOSIS — Z1231 Encounter for screening mammogram for malignant neoplasm of breast: Secondary | ICD-10-CM

## 2024-01-17 DIAGNOSIS — F411 Generalized anxiety disorder: Secondary | ICD-10-CM | POA: Diagnosis not present

## 2024-01-25 DIAGNOSIS — F411 Generalized anxiety disorder: Secondary | ICD-10-CM | POA: Diagnosis not present

## 2024-02-02 DIAGNOSIS — F411 Generalized anxiety disorder: Secondary | ICD-10-CM | POA: Diagnosis not present

## 2024-02-15 DIAGNOSIS — F411 Generalized anxiety disorder: Secondary | ICD-10-CM | POA: Diagnosis not present

## 2024-02-22 DIAGNOSIS — F411 Generalized anxiety disorder: Secondary | ICD-10-CM | POA: Diagnosis not present

## 2024-03-02 DIAGNOSIS — F329 Major depressive disorder, single episode, unspecified: Secondary | ICD-10-CM | POA: Diagnosis not present

## 2024-03-02 DIAGNOSIS — F411 Generalized anxiety disorder: Secondary | ICD-10-CM | POA: Diagnosis not present

## 2024-03-09 DIAGNOSIS — F329 Major depressive disorder, single episode, unspecified: Secondary | ICD-10-CM | POA: Diagnosis not present

## 2024-03-09 DIAGNOSIS — F411 Generalized anxiety disorder: Secondary | ICD-10-CM | POA: Diagnosis not present

## 2024-03-22 DIAGNOSIS — F329 Major depressive disorder, single episode, unspecified: Secondary | ICD-10-CM | POA: Diagnosis not present

## 2024-03-22 DIAGNOSIS — F411 Generalized anxiety disorder: Secondary | ICD-10-CM | POA: Diagnosis not present

## 2024-03-29 DIAGNOSIS — F329 Major depressive disorder, single episode, unspecified: Secondary | ICD-10-CM | POA: Diagnosis not present

## 2024-03-29 DIAGNOSIS — F411 Generalized anxiety disorder: Secondary | ICD-10-CM | POA: Diagnosis not present

## 2024-04-12 DIAGNOSIS — F411 Generalized anxiety disorder: Secondary | ICD-10-CM | POA: Diagnosis not present

## 2024-04-12 DIAGNOSIS — F329 Major depressive disorder, single episode, unspecified: Secondary | ICD-10-CM | POA: Diagnosis not present

## 2024-04-19 DIAGNOSIS — F329 Major depressive disorder, single episode, unspecified: Secondary | ICD-10-CM | POA: Diagnosis not present

## 2024-04-19 DIAGNOSIS — F411 Generalized anxiety disorder: Secondary | ICD-10-CM | POA: Diagnosis not present

## 2024-04-25 DIAGNOSIS — F411 Generalized anxiety disorder: Secondary | ICD-10-CM | POA: Diagnosis not present

## 2024-04-25 DIAGNOSIS — F329 Major depressive disorder, single episode, unspecified: Secondary | ICD-10-CM | POA: Diagnosis not present

## 2024-05-03 DIAGNOSIS — F411 Generalized anxiety disorder: Secondary | ICD-10-CM | POA: Diagnosis not present

## 2024-05-03 DIAGNOSIS — F329 Major depressive disorder, single episode, unspecified: Secondary | ICD-10-CM | POA: Diagnosis not present

## 2024-05-09 DIAGNOSIS — F411 Generalized anxiety disorder: Secondary | ICD-10-CM | POA: Diagnosis not present

## 2024-05-09 DIAGNOSIS — F329 Major depressive disorder, single episode, unspecified: Secondary | ICD-10-CM | POA: Diagnosis not present

## 2024-05-25 DIAGNOSIS — F411 Generalized anxiety disorder: Secondary | ICD-10-CM | POA: Diagnosis not present

## 2024-05-25 DIAGNOSIS — F329 Major depressive disorder, single episode, unspecified: Secondary | ICD-10-CM | POA: Diagnosis not present

## 2024-05-31 DIAGNOSIS — F329 Major depressive disorder, single episode, unspecified: Secondary | ICD-10-CM | POA: Diagnosis not present

## 2024-05-31 DIAGNOSIS — F411 Generalized anxiety disorder: Secondary | ICD-10-CM | POA: Diagnosis not present

## 2024-06-06 DIAGNOSIS — F329 Major depressive disorder, single episode, unspecified: Secondary | ICD-10-CM | POA: Diagnosis not present

## 2024-06-06 DIAGNOSIS — F411 Generalized anxiety disorder: Secondary | ICD-10-CM | POA: Diagnosis not present

## 2024-06-14 DIAGNOSIS — Z01419 Encounter for gynecological examination (general) (routine) without abnormal findings: Secondary | ICD-10-CM | POA: Diagnosis not present

## 2024-06-14 DIAGNOSIS — N951 Menopausal and female climacteric states: Secondary | ICD-10-CM | POA: Diagnosis not present

## 2024-06-27 DIAGNOSIS — F411 Generalized anxiety disorder: Secondary | ICD-10-CM | POA: Diagnosis not present

## 2024-06-27 DIAGNOSIS — F329 Major depressive disorder, single episode, unspecified: Secondary | ICD-10-CM | POA: Diagnosis not present

## 2024-07-20 DIAGNOSIS — F411 Generalized anxiety disorder: Secondary | ICD-10-CM | POA: Diagnosis not present

## 2024-07-20 DIAGNOSIS — F329 Major depressive disorder, single episode, unspecified: Secondary | ICD-10-CM | POA: Diagnosis not present

## 2024-07-25 DIAGNOSIS — Z23 Encounter for immunization: Secondary | ICD-10-CM | POA: Diagnosis not present

## 2024-08-01 DIAGNOSIS — F411 Generalized anxiety disorder: Secondary | ICD-10-CM | POA: Diagnosis not present

## 2024-08-01 DIAGNOSIS — F329 Major depressive disorder, single episode, unspecified: Secondary | ICD-10-CM | POA: Diagnosis not present

## 2024-08-11 DIAGNOSIS — F329 Major depressive disorder, single episode, unspecified: Secondary | ICD-10-CM | POA: Diagnosis not present

## 2024-08-11 DIAGNOSIS — F411 Generalized anxiety disorder: Secondary | ICD-10-CM | POA: Diagnosis not present

## 2024-08-15 DIAGNOSIS — F411 Generalized anxiety disorder: Secondary | ICD-10-CM | POA: Diagnosis not present

## 2024-08-15 DIAGNOSIS — F329 Major depressive disorder, single episode, unspecified: Secondary | ICD-10-CM | POA: Diagnosis not present

## 2024-09-19 DIAGNOSIS — M8589 Other specified disorders of bone density and structure, multiple sites: Secondary | ICD-10-CM | POA: Diagnosis not present

## 2024-09-19 DIAGNOSIS — M8580 Other specified disorders of bone density and structure, unspecified site: Secondary | ICD-10-CM | POA: Diagnosis not present

## 2024-09-19 DIAGNOSIS — R7989 Other specified abnormal findings of blood chemistry: Secondary | ICD-10-CM | POA: Diagnosis not present

## 2024-09-19 DIAGNOSIS — Z0000 Encounter for general adult medical examination without abnormal findings: Secondary | ICD-10-CM | POA: Diagnosis not present

## 2024-09-19 DIAGNOSIS — Z1389 Encounter for screening for other disorder: Secondary | ICD-10-CM | POA: Diagnosis not present

## 2024-09-19 DIAGNOSIS — Z0189 Encounter for other specified special examinations: Secondary | ICD-10-CM | POA: Diagnosis not present

## 2024-09-20 DIAGNOSIS — Z1339 Encounter for screening examination for other mental health and behavioral disorders: Secondary | ICD-10-CM | POA: Diagnosis not present

## 2024-09-20 DIAGNOSIS — Z0000 Encounter for general adult medical examination without abnormal findings: Secondary | ICD-10-CM | POA: Diagnosis not present

## 2024-09-20 DIAGNOSIS — Z1331 Encounter for screening for depression: Secondary | ICD-10-CM | POA: Diagnosis not present

## 2024-09-20 DIAGNOSIS — N052 Unspecified nephritic syndrome with diffuse membranous glomerulonephritis: Secondary | ICD-10-CM | POA: Diagnosis not present

## 2024-10-10 DIAGNOSIS — F329 Major depressive disorder, single episode, unspecified: Secondary | ICD-10-CM | POA: Diagnosis not present

## 2024-11-03 DIAGNOSIS — F411 Generalized anxiety disorder: Secondary | ICD-10-CM | POA: Diagnosis not present

## 2024-11-03 DIAGNOSIS — F329 Major depressive disorder, single episode, unspecified: Secondary | ICD-10-CM | POA: Diagnosis not present

## 2024-11-09 DIAGNOSIS — F411 Generalized anxiety disorder: Secondary | ICD-10-CM | POA: Diagnosis not present

## 2024-11-09 DIAGNOSIS — F329 Major depressive disorder, single episode, unspecified: Secondary | ICD-10-CM | POA: Diagnosis not present
